# Patient Record
Sex: Male | Born: 2005 | Race: Black or African American | Hispanic: No | Marital: Single | Smoking: Never smoker
Health system: Southern US, Community
[De-identification: ages and names within clinical notes are randomized; demographics above are authoritative.]

## PROBLEM LIST (undated history)

## (undated) ENCOUNTER — Emergency Department: Discharge status: Absent without leave

## (undated) DIAGNOSIS — G47419 Narcolepsy without cataplexy: Secondary | ICD-10-CM

## (undated) DIAGNOSIS — R945 Abnormal results of liver function studies: Secondary | ICD-10-CM

## (undated) DIAGNOSIS — R7989 Other specified abnormal findings of blood chemistry: Secondary | ICD-10-CM

## (undated) HISTORY — DX: Other specified abnormal findings of blood chemistry: R79.89

## (undated) HISTORY — DX: Abnormal results of liver function studies: R94.5

---
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
## 2024-03-07 ENCOUNTER — Encounter: Payer: Self-pay | Admitting: Emergency Medicine

## 2024-03-07 ENCOUNTER — Other Ambulatory Visit: Payer: Self-pay

## 2024-03-07 ENCOUNTER — Emergency Department
Admission: EM | Admit: 2024-03-07 | Discharge: 2024-03-07 | Disposition: A | Discharge status: Home / Self Care | Attending: Student in an Organized Health Care Education/Training Program | Admitting: Student in an Organized Health Care Education/Training Program

## 2024-03-07 DIAGNOSIS — Y92410 Unspecified street and highway as the place of occurrence of the external cause: Secondary | ICD-10-CM | POA: Diagnosis not present

## 2024-03-07 DIAGNOSIS — S50812A Abrasion of left forearm, initial encounter: Secondary | ICD-10-CM | POA: Diagnosis not present

## 2024-03-07 DIAGNOSIS — S59912A Unspecified injury of left forearm, initial encounter: Secondary | ICD-10-CM | POA: Diagnosis present

## 2024-03-07 HISTORY — DX: Narcolepsy without cataplexy: G47.419

## 2024-03-07 NOTE — Discharge Instructions (Signed)
 You can take 650 mg of Tylenol and 600 mg of ibuprofen every 6 hours as needed for pain.  He can use ice, heat and muscle creams as needed.  Return to the emergency department with worsening pain, development of chest pain, shortness of breath, abdominal pain and abdominal bruising.
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
## 2024-06-19 ENCOUNTER — Other Ambulatory Visit: Payer: Self-pay | Admitting: Student

## 2024-06-19 ENCOUNTER — Encounter: Payer: Self-pay | Admitting: Student

## 2024-06-19 DIAGNOSIS — R4689 Other symptoms and signs involving appearance and behavior: Secondary | ICD-10-CM

## 2024-06-19 DIAGNOSIS — R519 Headache, unspecified: Secondary | ICD-10-CM

## 2024-06-21 ENCOUNTER — Ambulatory Visit
Admission: RE | Admit: 2024-06-21 | Discharge: 2024-06-21 | Disposition: A | Discharge status: Home / Self Care | Source: Ambulatory Visit | Attending: Student

## 2024-06-21 DIAGNOSIS — R4689 Other symptoms and signs involving appearance and behavior: Secondary | ICD-10-CM

## 2024-06-21 DIAGNOSIS — R519 Headache, unspecified: Secondary | ICD-10-CM

## 2024-08-08 ENCOUNTER — Encounter (HOSPITAL_COMMUNITY): Payer: Self-pay

## 2024-08-08 ENCOUNTER — Emergency Department
Admission: EM | Admit: 2024-08-08 | Discharge: 2024-08-08 | Disposition: A | Discharge status: Other Acute Inpatient Hospital | Attending: Emergency Medicine | Admitting: Emergency Medicine

## 2024-08-08 ENCOUNTER — Other Ambulatory Visit: Payer: Self-pay

## 2024-08-08 DIAGNOSIS — R569 Unspecified convulsions: Secondary | ICD-10-CM | POA: Diagnosis present

## 2024-08-08 LAB — BASIC METABOLIC PANEL WITH GFR
Anion gap: 9 (ref 5–15)
BUN: 9 mg/dL (ref 6–20)
CO2: 27 mmol/L (ref 22–32)
Calcium: 9.2 mg/dL (ref 8.9–10.3)
Chloride: 103 mmol/L (ref 98–111)
Creatinine, Ser: 0.85 mg/dL (ref 0.61–1.24)
GFR, Estimated: >60 mL/min (ref >60)
Glucose, Bld: 97 mg/dL (ref 70–99)
Potassium: 4.3 mmol/L (ref 3.5–5.1)
Sodium: 139 mmol/L (ref 135–145)

## 2024-08-08 LAB — CBC
HCT: 48.6 % (ref 39.0–52.0)
Hemoglobin: 16.5 g/dL (ref 13.0–17.0)
MCH: 29.5 pg (ref 26.0–34.0)
MCHC: 34 g/dL (ref 30.0–36.0)
MCV: 86.8 fL (ref 80.0–100.0)
Platelets: 232 K/uL (ref 150–400)
RBC: 5.6 MIL/uL (ref 4.22–5.81)
RDW: 11.7 % (ref 11.5–15.5)
WBC: 5.3 K/uL (ref 4.0–10.5)
nRBC: 0 % (ref 0.0–0.2)

## 2024-08-08 LAB — CK: Total CK: 1149 U/L — ABNORMAL HIGH (ref 49–397)

## 2024-08-08 MED ORDER — LORAZEPAM 1 MG PO TABS
1.0000 mg | ORAL_TABLET | Freq: Once | ORAL | Status: AC
  Administered 2024-08-08: 1 mg via ORAL
  Filled 2024-08-08: qty 1

## 2024-08-08 NOTE — ED Notes (Signed)
 [facility] called to Steve Holland for transfer per Dr, Dorothyann Md. , spoke with [facility]
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
## 2024-08-08 NOTE — ED Notes (Signed)
 Pt provided with food, okay per [facility] MD

## 2024-08-08 NOTE — ED Triage Notes (Addendum)
 Pt comes with c/o muscle spasms. Pt brought over by [facility]. Pt denies hx of this and no pain noted.   Pt states he was at [facility] having EEG done for his narcolepsy and this started after. Pt having episodes in triage jerking movements and stuttering with words.

## 2024-08-09 ENCOUNTER — Inpatient Hospital Stay (HOSPITAL_COMMUNITY)

## 2024-08-09 ENCOUNTER — Encounter (HOSPITAL_COMMUNITY): Payer: Self-pay | Admitting: Internal Medicine

## 2024-08-09 ENCOUNTER — Inpatient Hospital Stay (HOSPITAL_COMMUNITY)
Admission: EM | Admit: 2024-08-09 | Discharge: 2024-08-10 | DRG: 101 | Disposition: A | Discharge status: Home / Self Care | Attending: Family Medicine | Admitting: Family Medicine

## 2024-08-09 DIAGNOSIS — G47419 Narcolepsy without cataplexy: Secondary | ICD-10-CM | POA: Diagnosis present

## 2024-08-09 DIAGNOSIS — F909 Attention-deficit hyperactivity disorder, unspecified type: Secondary | ICD-10-CM | POA: Diagnosis present

## 2024-08-09 DIAGNOSIS — R253 Fasciculation: Secondary | ICD-10-CM | POA: Diagnosis not present

## 2024-08-09 DIAGNOSIS — M62838 Other muscle spasm: Secondary | ICD-10-CM | POA: Diagnosis present

## 2024-08-09 DIAGNOSIS — Z91128 Patient's intentional underdosing of medication regimen for other reason: Secondary | ICD-10-CM

## 2024-08-09 DIAGNOSIS — T43626A Underdosing of amphetamines, initial encounter: Secondary | ICD-10-CM | POA: Diagnosis present

## 2024-08-09 DIAGNOSIS — R569 Unspecified convulsions: Principal | ICD-10-CM | POA: Diagnosis present

## 2024-08-09 LAB — RAPID URINE DRUG SCREEN, HOSP PERFORMED
Amphetamines: NOT DETECTED
Barbiturates: NOT DETECTED
Benzodiazepines: POSITIVE — AB
Cocaine: NOT DETECTED
Opiates: NOT DETECTED
Tetrahydrocannabinol: NOT DETECTED

## 2024-08-09 LAB — BASIC METABOLIC PANEL WITH GFR
Anion gap: 8 (ref 5–15)
BUN: 7 mg/dL (ref 6–20)
CO2: 27 mmol/L (ref 22–32)
Calcium: 8.7 mg/dL — ABNORMAL LOW (ref 8.9–10.3)
Chloride: 102 mmol/L (ref 98–111)
Creatinine, Ser: 0.87 mg/dL (ref 0.61–1.24)
GFR, Estimated: >60 mL/min (ref >60)
Glucose, Bld: 96 mg/dL (ref 70–99)
Potassium: 3.7 mmol/L (ref 3.5–5.1)
Sodium: 137 mmol/L (ref 135–145)

## 2024-08-09 LAB — CBC
HCT: 44 % (ref 39.0–52.0)
Hemoglobin: 15 g/dL (ref 13.0–17.0)
MCH: 29.6 pg (ref 26.0–34.0)
MCHC: 34.1 g/dL (ref 30.0–36.0)
MCV: 86.8 fL (ref 80.0–100.0)
Platelets: 216 K/uL (ref 150–400)
RBC: 5.07 MIL/uL (ref 4.22–5.81)
RDW: 11.9 % (ref 11.5–15.5)
WBC: 5.7 K/uL (ref 4.0–10.5)
nRBC: 0 % (ref 0.0–0.2)

## 2024-08-09 LAB — HIV ANTIBODY (ROUTINE TESTING W REFLEX): HIV Screen 4th Generation wRfx: NONREACTIVE

## 2024-08-09 MED ORDER — ENOXAPARIN SODIUM 60 MG/0.6ML IJ SOSY
50.0000 mg | PREFILLED_SYRINGE | Freq: Every day | INTRAMUSCULAR | Status: DC
  Administered 2024-08-09 – 2024-08-10 (×2): 50 mg via SUBCUTANEOUS
  Filled 2024-08-09 (×2): qty 0.6

## 2024-08-09 MED ORDER — METHOCARBAMOL 500 MG PO TABS
500.0000 mg | ORAL_TABLET | Freq: Three times a day (TID) | ORAL | Status: DC
  Administered 2024-08-09 – 2024-08-10 (×3): 500 mg via ORAL
  Filled 2024-08-09 (×3): qty 1

## 2024-08-09 MED ORDER — DIAZEPAM 5 MG/ML IJ SOLN: 5.0000 mg | INTRAMUSCULAR | Status: DC | PRN

## 2024-08-09 MED ORDER — SODIUM CHLORIDE 0.9 % IV SOLN: INTRAVENOUS | Status: AC

## 2024-08-09 MED ORDER — DIAZEPAM 5 MG/ML IJ SOLN: 2.5000 mg | Freq: Three times a day (TID) | INTRAMUSCULAR | Status: DC | PRN

## 2024-08-09 MED ORDER — SODIUM CHLORIDE 0.9 % IV BOLUS
1000.0000 mL | Freq: Once | INTRAVENOUS | Status: AC
  Administered 2024-08-09: 1000 mL via INTRAVENOUS

## 2024-08-09 MED ORDER — HEPARIN SODIUM (PORCINE) 5000 UNIT/ML IJ SOLN: 5000.0000 [IU] | Freq: Three times a day (TID) | INTRAMUSCULAR | Status: DC

## 2024-08-09 MED ORDER — DIAZEPAM 5 MG/ML IJ SOLN: 2.5000 mg | INTRAMUSCULAR | Status: DC | PRN

## 2024-08-09 NOTE — Plan of Care (Signed)

## 2024-08-09 NOTE — Plan of Care (Signed)
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
## 2024-08-09 NOTE — Progress Notes (Signed)
 LTM EEG hooked up and running - no initial skin breakdown - push button tested - [facility] monitoring.

## 2024-08-09 NOTE — Procedures (Signed)
 [name]  MRN: [id number]  Epilepsy Attending: Arlin MALVA Krebs  Referring Physician/Provider: Voncile Isles, MD  Duration: 08/09/2024 0303 to 1310  Patient history:  18 y.o. male past history of narcolepsy on Adderall, also of asthma on medications as above coming in for evaluation of uncontrollable twitching of lower extremities that started while he was getting EEG this morning at [facility] clinic neurology clinic. EEG to evaluate for seizure.  Level of alertness: Awake, asleep  AEDs during EEG study: None  Technical aspects: This EEG study was done with scalp electrodes positioned according to the 10-20 International system of electrode placement. Electrical activity was reviewed with band pass filter of 1-70Hz , sensitivity of 7 uV/mm, display speed of 48mm/sec with a 60Hz  notched filter applied as appropriate. EEG data were recorded continuously and digitally stored.  Video monitoring was available and reviewed as appropriate.  Description: The posterior dominant rhythm consists of 9-10 Hz activity of moderate voltage (25-35 uV) seen predominantly in posterior head regions, symmetric and reactive to eye opening and eye closing. Sleep was characterized by vertex waves, sleep spindles (12 to 14 Hz), maximal frontocentral region. Hyperventilation and photic stimulation were not performed.     IMPRESSION: This study is within normal limits. No seizures or epileptiform discharges were seen throughout the recording.  A normal interictal EEG does not exclude the diagnosis of epilepsy.   Princess Karnes O Aloysius Heinle
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
## 2024-08-09 NOTE — Progress Notes (Signed)
 Patient ID: Steve Holland, male   DOB: 2006/09/11, 18 y.o.   MRN: [id number]   Multiple continuous contractions of his quadriceps.  Overall it is getting better he had spasms in his upper extremities previously and described as all over now its only isolated to his legs.  LTM has captured multiple episodes all negative.  Discontinued today.  He tells me he is taking his Adderall twice a day.  Discussed that Adderall is likely causing his elevated CK and muscle twitching.  After I mention this he said he is actually not taking his Adderall and is not on any medication.  I asked if he is on any other supplements over-the-counter he denied everything.  At this point continue with hydration can use as needed muscle relaxers.  Neurology to sign off.  No charge same-day note.

## 2024-08-09 NOTE — Progress Notes (Signed)
 LTM EEG disconnected - no skin breakdown at [facility]. [facility] notified.

## 2024-08-10 ENCOUNTER — Other Ambulatory Visit (HOSPITAL_COMMUNITY): Payer: Self-pay

## 2024-08-10 DIAGNOSIS — R569 Unspecified convulsions: Secondary | ICD-10-CM | POA: Diagnosis not present

## 2024-08-10 LAB — BASIC METABOLIC PANEL WITH GFR
Anion gap: 6 (ref 5–15)
BUN: 8 mg/dL (ref 6–20)
CO2: 27 mmol/L (ref 22–32)
Calcium: 8.9 mg/dL (ref 8.9–10.3)
Chloride: 103 mmol/L (ref 98–111)
Creatinine, Ser: 0.86 mg/dL (ref 0.61–1.24)
GFR, Estimated: >60 mL/min (ref >60)
Glucose, Bld: 101 mg/dL — ABNORMAL HIGH (ref 70–99)
Potassium: 3.9 mmol/L (ref 3.5–5.1)
Sodium: 136 mmol/L (ref 135–145)

## 2024-08-10 LAB — CBC
HCT: 43.8 % (ref 39.0–52.0)
Hemoglobin: 15.1 g/dL (ref 13.0–17.0)
MCH: 29.7 pg (ref 26.0–34.0)
MCHC: 34.5 g/dL (ref 30.0–36.0)
MCV: 86.2 fL (ref 80.0–100.0)
Platelets: 221 K/uL (ref 150–400)
RBC: 5.08 MIL/uL (ref 4.22–5.81)
RDW: 11.6 % (ref 11.5–15.5)
WBC: 5.8 K/uL (ref 4.0–10.5)
nRBC: 0 % (ref 0.0–0.2)

## 2024-08-10 LAB — MAGNESIUM: Magnesium: 1.8 mg/dL (ref 1.7–2.4)

## 2024-08-10 MED ORDER — METHOCARBAMOL 500 MG PO TABS
500.0000 mg | ORAL_TABLET | Freq: Three times a day (TID) | ORAL | 0 refills | Status: AC | PRN
  Filled 2024-08-10: qty 42, 14d supply, fill #0

## 2024-08-10 NOTE — Discharge Summary (Addendum)
 Physician Discharge Summary  Steve Holland FMW:[id number] DOB: 09/17/2006 DOA: 08/09/2024  PCP: Dow Rolland SAILOR, MD  Admit date: 08/09/2024 Discharge date: 08/10/2024  Time spent: 40 minutes  Recommendations for Outpatient Follow-up:  Follow outpatient CBC/CMP, magnesium Repeat CK outpatient  Follow with neurology outpatient   Discharge Diagnoses:  Principal Problem:   Seizure [facility])   Discharge Condition: stable  Diet recommendation: heart healthy   Filed Weights   08/09/24 1600  Weight: 93 kg    History of present illness:   Steve Holland is Steve Holland 18 y.o. male with medical hx narcolepsy and ADHD presenting with tremors/muscle spasms.  EEG negative.  Neurology recommending muscle relaxers prn.  Stable for discharge 9/7.  Hospital Course:  Assessment and Plan:  60M h/o narcolepsy p/w UE and LE tremors.   UE and LE tremors  Muscle Spasms H/o narcolepsy Possible seizure -Neurology consulted for LTM; apprec eval/recs - EEG without seiuzre or epileptiform discharges.  Neurology signed off, ok to continue as needed muscle relaxers. - He should follow outpatient with his primary neurologist  Hx ADHD He's not taking adderall at this time     Procedures:  EEG IMPRESSION: This study is within normal limits. No seizures or epileptiform discharges were seen throughout the recording.   Steve Holland normal interictal EEG does not exclude the diagnosis of epilepsy.  Consultations: neurology  Discharge Exam: Vitals:   08/10/24 0721 08/10/24 0800  BP: 116/68   Pulse: (!) 53   Resp: 15 19  Temp: 98.2 F (36.8 C)   SpO2: 100%    No new complaints Feeling better  General: No acute distress. Cardiovascular: RRR Lungs: unlabored Abdomen: Soft, nontender, nondistended  Neurological: Alert and oriented 3. Moves all extremities 4 with equal strength. Cranial nerves II through XII grossly intact. Extremities: No clubbing or cyanosis. No edema.   Discharge  Instructions   Discharge Instructions     Call MD for:  difficulty breathing, headache or visual disturbances   Complete by: As directed    Call MD for:  extreme fatigue   Complete by: As directed    Call MD for:  hives   Complete by: As directed    Call MD for:  persistant dizziness or light-headedness   Complete by: As directed    Call MD for:  persistant nausea and vomiting   Complete by: As directed    Call MD for:  redness, tenderness, or signs of infection (pain, swelling, redness, odor or green/yellow discharge around incision site)   Complete by: As directed    Call MD for:  severe uncontrolled pain   Complete by: As directed    Call MD for:  temperature >100.4   Complete by: As directed    Diet - low sodium heart healthy   Complete by: As directed    Discharge instructions   Complete by: As directed    You were seen with concern for possible seizures.  You had an EEG here which did not show seizure activity.  We'll send you home on robaxin  as needed for muscle spasms.  You should follow up with your outpatient neurologist.  Return for new, recurrent, or worsening symptoms.  Please ask your PCP to request records from this hospitalization so they know what was done and what the next steps will be.   Increase activity slowly   Complete by: As directed       Allergies as of 08/10/2024   No Known Allergies  Medication List     TAKE these medications    methocarbamol  500 MG tablet Commonly known as: ROBAXIN  Take 1 tablet (500 mg total) by mouth every 8 (eight) hours as needed for up to 14 days for muscle spasms. Follow with outpatient provider for refills as needed       No Known Allergies    The results of significant diagnostics from this hospitalization (including imaging, microbiology, ancillary and laboratory) are listed below for reference.    Significant Diagnostic Studies: Overnight EEG with video Result Date: 08/09/2024 Steve Steve KIDD, MD     08/10/2024  8:21 AM [name] MRN: [id number] Epilepsy Attending: Arlin Holland Steve Referring Physician/Provider: Voncile Isles, MD Duration: 08/09/2024 0303 to 1310 Patient history:  18 y.o. male past history of narcolepsy on Adderall, also of asthma on medications as above coming in for evaluation of uncontrollable twitching of lower extremities that started while he was getting EEG this morning at [facility]. EEG to evaluate for seizure. Level of alertness: Awake, asleep AEDs during EEG study: None Technical aspects: This EEG study was done with scalp electrodes positioned according to the 10-20 International system of electrode placement. Electrical activity was reviewed with band pass filter of 1-70Hz , sensitivity of 7 uV/mm, display speed of 52mm/sec with [place] Weiand 60Hz  notched filter applied as appropriate. EEG data were recorded continuously and digitally stored.  Video monitoring was available and reviewed as appropriate. Description: The posterior dominant rhythm consists of 9-10 Hz activity of moderate voltage (25-35 uV) seen predominantly in posterior head regions, symmetric and reactive to eye opening and eye closing. Sleep was characterized by vertex waves, sleep spindles (12 to 14 Hz), maximal frontocentral region. Hyperventilation and photic stimulation were not performed.   IMPRESSION: This study is within normal limits. No seizures or epileptiform discharges were seen throughout the recording. Steve Holland normal interictal EEG does not exclude the diagnosis of epilepsy. Steve Holland    Microbiology: No results found for this or any previous visit (from the past 240 hours).   Labs: Basic Metabolic Panel: Recent Labs  Lab 08/08/24 1213 08/09/24 0747 08/10/24 0230  NA 139 137 136  K 4.3 3.7 3.9  CL 103 102 103  CO2 27 27 27   GLUCOSE 97 96 101*  BUN 9 7 8   CREATININE 0.85 0.87 0.86  CALCIUM 9.2 8.7* 8.9   Liver Function Tests: No results for  input(s): AST, ALT, ALKPHOS, BILITOT, PROT, ALBUMIN in the last 168 hours. No results for input(s): LIPASE, AMYLASE in the last 168 hours. No results for input(s): AMMONIA in the last 168 hours. CBC: Recent Labs  Lab 08/08/24 1213 08/09/24 0747 08/10/24 0230  WBC 5.3 5.7 5.8  HGB 16.5 15.0 15.1  HCT 48.6 44.0 43.8  MCV 86.8 86.8 86.2  PLT 232 216 221   Cardiac Enzymes: Recent Labs  Lab 08/08/24 1213  CKTOTAL 1,149*   BNP: BNP (last 3 results) No results for input(s): BNP in the last 8760 hours.  ProBNP (last 3 results) No results for input(s): PROBNP in the last 8760 hours.  CBG: No results for input(s): GLUCAP in the last 168 hours.     Signed:  Meliton Monte MD.  [facility] 08/10/2024, 11:03 AM

## 2024-08-10 NOTE — Plan of Care (Signed)
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
